# Patient Record
Sex: Female | Born: 1980 | Race: White | Hispanic: No | Marital: Married | Smoking: Former smoker
Health system: Southern US, Community
[De-identification: ages and names within clinical notes are randomized; demographics above are authoritative.]

## PROBLEM LIST (undated history)

## (undated) DIAGNOSIS — B019 Varicella without complication: Secondary | ICD-10-CM

## (undated) DIAGNOSIS — Z8781 Personal history of (healed) traumatic fracture: Secondary | ICD-10-CM

## (undated) HISTORY — DX: Varicella without complication: B01.9

## (undated) HISTORY — DX: Personal history of (healed) traumatic fracture: Z87.81

---

## 2003-05-05 ENCOUNTER — Encounter: Payer: Self-pay | Admitting: Gastroenterology

## 2003-05-05 ENCOUNTER — Ambulatory Visit (HOSPITAL_COMMUNITY): Admission: RE | Admit: 2003-05-05 | Discharge: 2003-05-05 | Payer: Self-pay | Admitting: Gastroenterology

## 2003-11-10 ENCOUNTER — Ambulatory Visit (HOSPITAL_COMMUNITY): Admission: RE | Admit: 2003-11-10 | Discharge: 2003-11-10 | Payer: Self-pay | Admitting: Internal Medicine

## 2003-12-07 ENCOUNTER — Ambulatory Visit (HOSPITAL_COMMUNITY): Admission: RE | Admit: 2003-12-07 | Discharge: 2003-12-07 | Payer: Self-pay | Admitting: Internal Medicine

## 2003-12-16 ENCOUNTER — Encounter: Admission: RE | Admit: 2003-12-16 | Discharge: 2003-12-16 | Payer: Self-pay | Admitting: Internal Medicine

## 2004-03-01 IMAGING — CT CT PELVIS W/ CM
1 series · 15 of 32 positions shown, 19 images · IV contrast (GASTRO & OMNIPAQUE 100)
Comparison: None.

CLINICAL DATA: 22 year old with chronic abdominal pain and constipation.  CON ? none.
 CT ABDOMEN AND PELVIS ? WITH CONTRAST ? 12/16/03
TECHNIQUE: During intravenous administration of 100 cc Omnipaque 300, helical CT through the abdomen and pelvis was performed at 5 mm collimation.  Oral contrast had been administered.  Delayed helical 5 mm images through the kidneys were obtained.

[Series 2: — · axial · 0.70mm/px · z∈[-305,-15]mm · 15 of 93 slices shown, 19 images]
[im 6/93  soft-tissue]
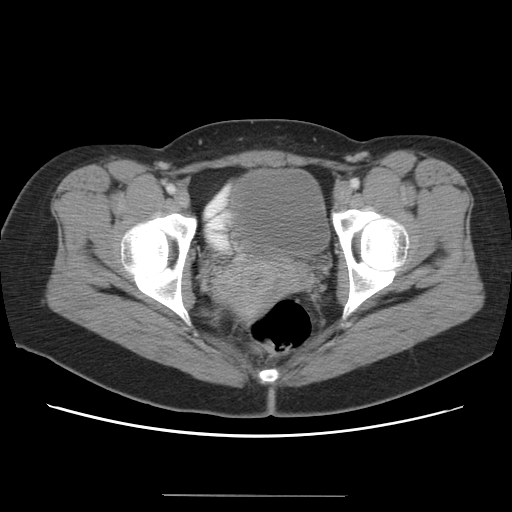
[im 6/93  bone]
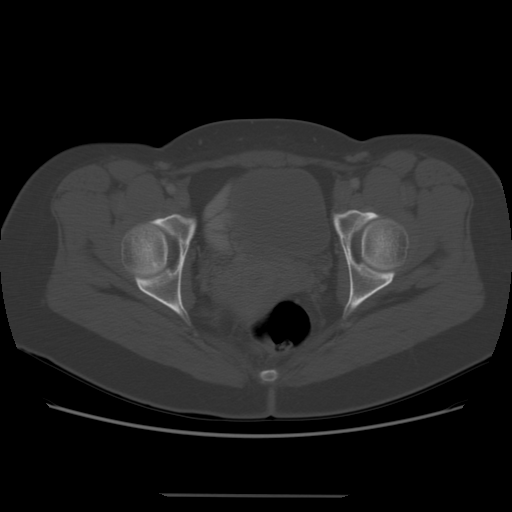
[im 12/93  soft-tissue]
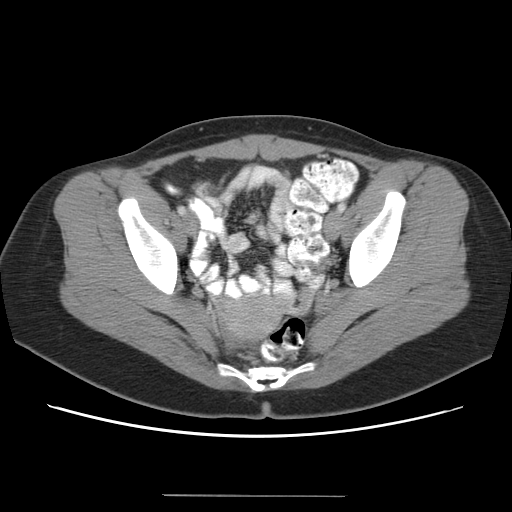
[im 18/93  soft-tissue]
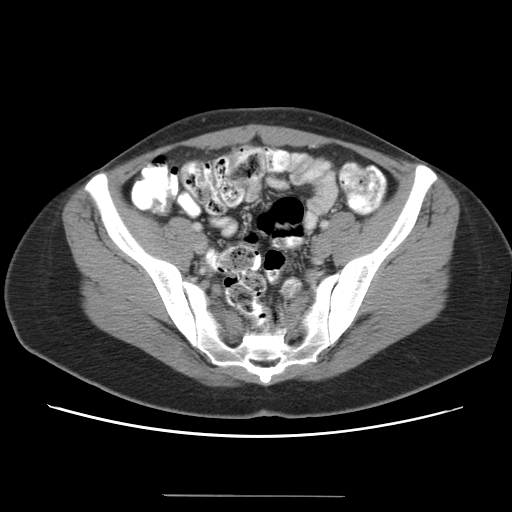
[im 27/93  soft-tissue]
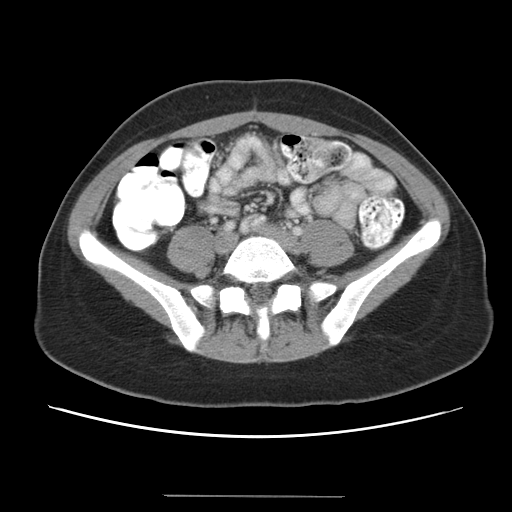
[im 33/93  soft-tissue]
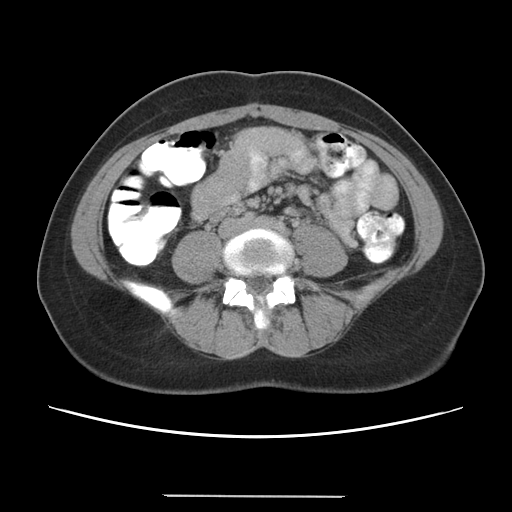
[im 39/93  soft-tissue]
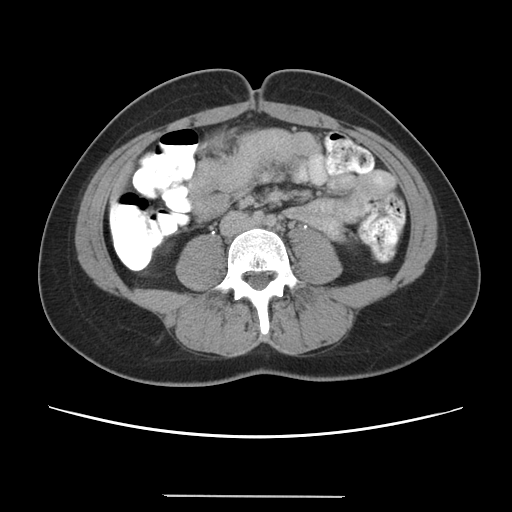
[im 48/93  soft-tissue]
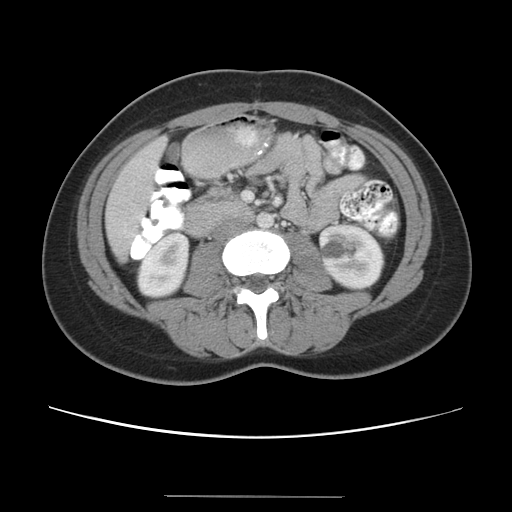
[im 54/93  soft-tissue]
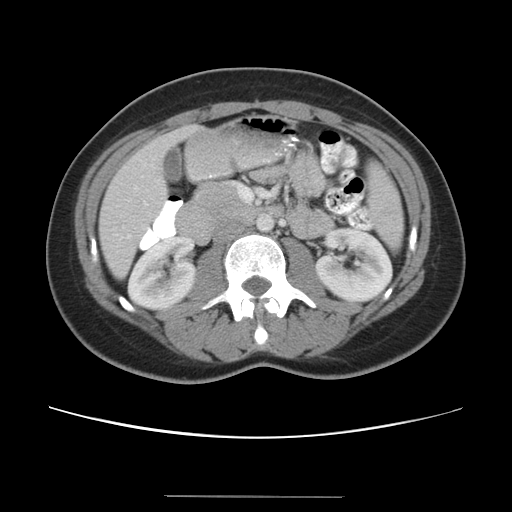
[im 60/93  soft-tissue]
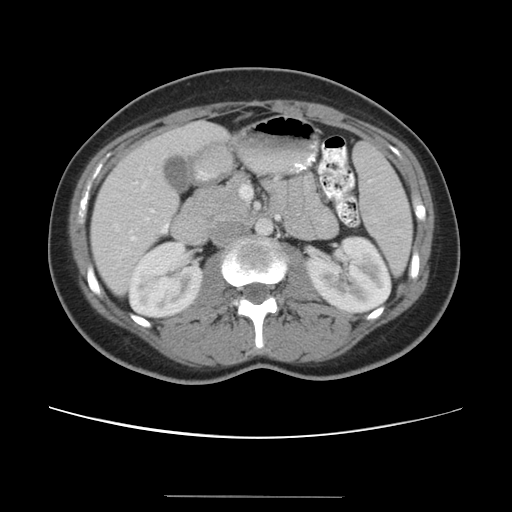
[im 60/93  bone]
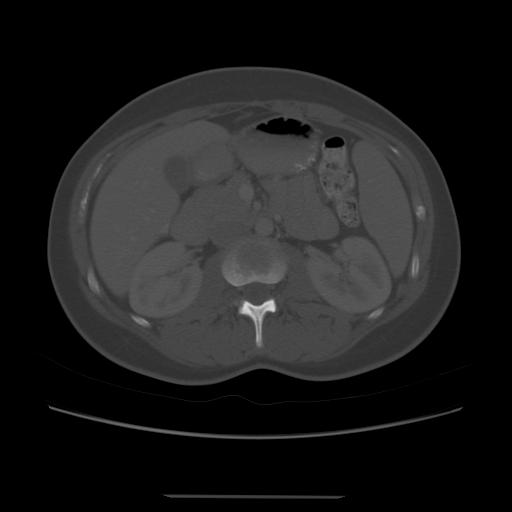
[im 66/93  soft-tissue]
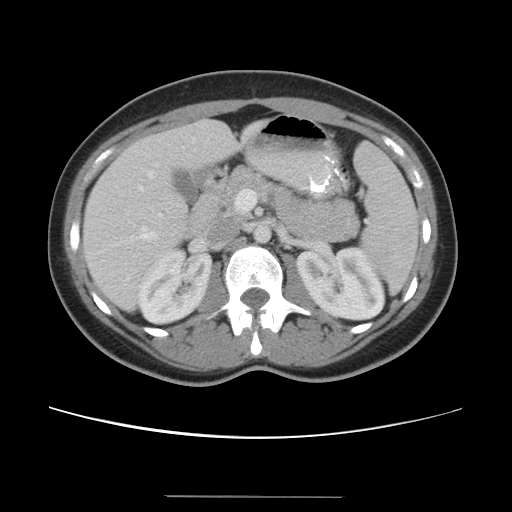
[im 75/93  soft-tissue]
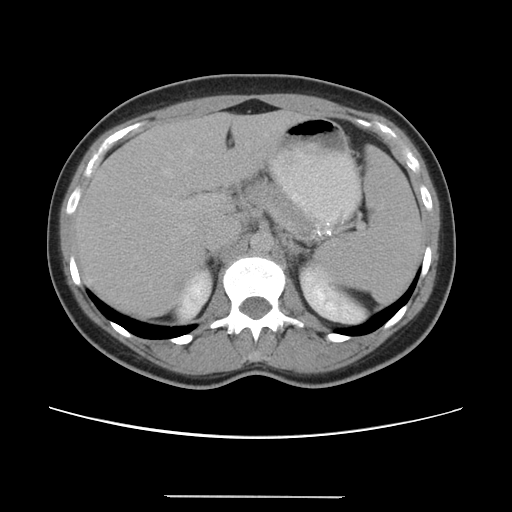
[im 81/93  soft-tissue]
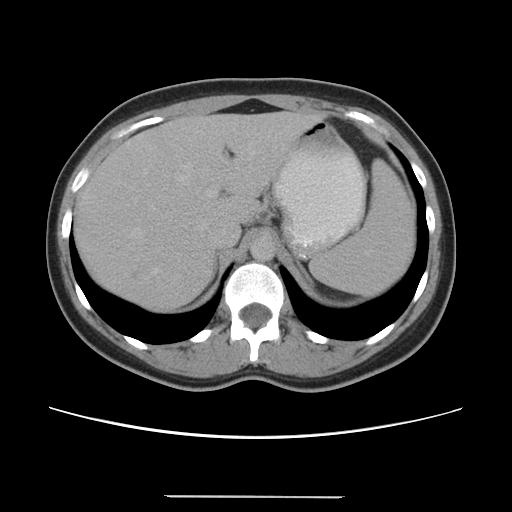
[im 81/93  lung]
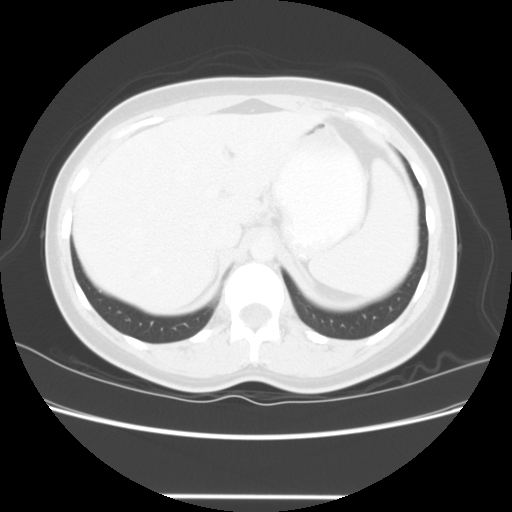
[im 84/93  lung]
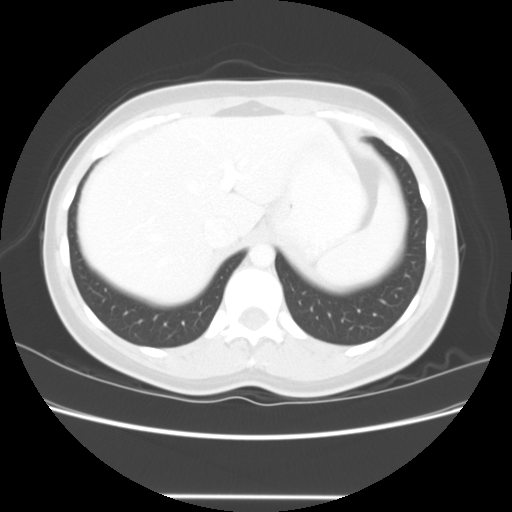
[im 87/93  soft-tissue]
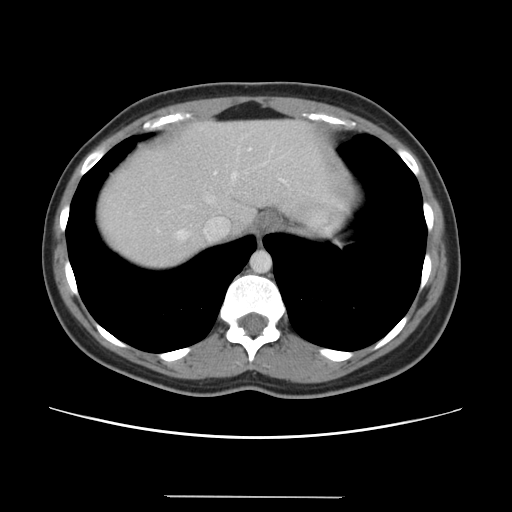
[im 87/93  lung]
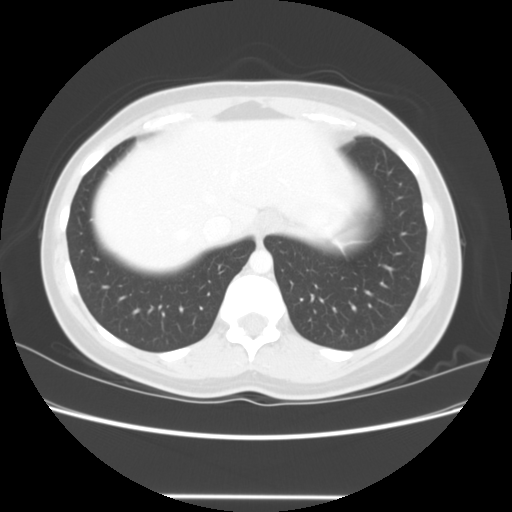
[im 90/93  lung]
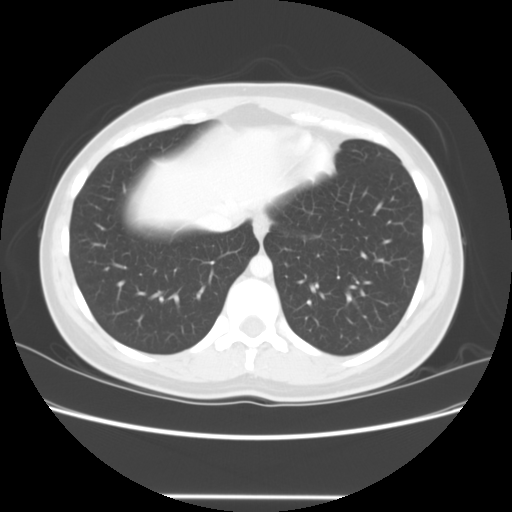

[15 of 32 positions shown; findings below may reference images not displayed]

CT ABDOMEN
 Liver is normal in size.  There is a 5 mm low attenuation lesion in the anterior segment of the right lobe near the dome (image 12) which is indeterminate but statistically most likely to represent a small cyst.  Spleen is upper normal in size and contains no focal abnormalities.  The pancreas has a normal appearance.  The gallbladder is unremarkable by CT and there is no biliary ductal dilatation.  Both adrenal glands are normal.  There is a simple cyst in the lower pole of the right kidney; no significant abnormalities are identified involving either kidney.  The stomach and visualized large and small bowel are unremarkable in the upper abdomen.  There is no significant lymphadenopathy.  The visualized lung bases appear clear.  
 IMPRESSION
 1.  Probable subcentimeter cyst in the anterior segment of the right lobe of the liver near the dome.
 2.  Left renal cyst.
 3.  Normal CT of the abdomen otherwise.
 CT PELVIS
 The uterus is normal in appearance for age.  There is an approximate 3.0 x 2.1 cm cyst arising from the left ovary.  Colon and small bowel are unremarkable.  The appendix is identified in the right mid pelvis and is normal in appearance.  There is no free fluid.  The urinary bladder is grossly normal. 
 IMPRESSION
 1.  Left ovarian cyst.
 2.  Normal CT of the pelvis otherwise.

## 2005-05-09 ENCOUNTER — Other Ambulatory Visit: Admission: RE | Admit: 2005-05-09 | Discharge: 2005-05-09 | Payer: Self-pay | Admitting: Internal Medicine

## 2005-05-09 ENCOUNTER — Ambulatory Visit: Payer: Self-pay | Admitting: Internal Medicine

## 2006-11-08 ENCOUNTER — Ambulatory Visit: Payer: Self-pay | Admitting: Internal Medicine

## 2006-12-16 ENCOUNTER — Ambulatory Visit: Payer: Self-pay | Admitting: Internal Medicine

## 2010-07-07 ENCOUNTER — Encounter: Payer: Self-pay | Admitting: Internal Medicine

## 2010-07-18 ENCOUNTER — Ambulatory Visit: Payer: Self-pay | Admitting: Internal Medicine

## 2011-01-16 NOTE — Assessment & Plan Note (Signed)
Summary: inj per kim//ccm  Nurse Visit   Allergies: No Known Drug Allergies  Immunizations Administered:  Tetanus Vaccine:    Vaccine Type: Tdap    Site: right deltoid    Mfr: [employer]    Dose: 0.5 ml    Route: IM    Given by: Duard Brady LPN    Exp. Date: 01/05/2012    Lot #: [id number]    VIS given: 11/04/07 version given July 18, 2010.    Physician counseled: yes  Orders Added: 1)  Tdap => 5yrs IM [90715] 2)  Admin 1st Vaccine [09811]

## 2011-01-16 NOTE — Miscellaneous (Signed)
Summary: Immunizations  Immunizations   Imported By: Georgian Co 07/07/2010 16:44:31  _____________________________________________________________________  External Attachment:    Type:   Image     Comment:   External Document

## 2011-05-08 ENCOUNTER — Other Ambulatory Visit: Payer: Self-pay

## 2011-05-08 MED ORDER — NORGESTIMATE-ETH ESTRADIOL 0.25-35 MG-MCG PO TABS: 1.0000 | ORAL_TABLET | Freq: Every day | ORAL | Status: DC

## 2011-05-08 NOTE — Telephone Encounter (Signed)
[email or web address] 

## 2012-03-25 ENCOUNTER — Other Ambulatory Visit: Payer: Self-pay

## 2012-03-25 MED ORDER — NORGESTIM-ETH ESTRAD TRIPHASIC 0.18/0.215/0.25 MG-35 MCG PO TABS: 1.0000 | ORAL_TABLET | Freq: Every day | ORAL | Status: DC

## 2012-03-25 MED ORDER — NORGESTIM-ETH ESTRAD TRIPHASIC 0.18/0.215/0.25 MG-35 MCG PO TABS: 1.0000 | ORAL_TABLET | Freq: Every day | ORAL | Status: AC

## 2013-04-24 ENCOUNTER — Other Ambulatory Visit (HOSPITAL_COMMUNITY)
Admission: RE | Admit: 2013-04-24 | Discharge: 2013-04-24 | Disposition: A | Discharge status: Home / Self Care | Payer: BC Managed Care – PPO | Source: Ambulatory Visit | Attending: Internal Medicine | Admitting: Internal Medicine

## 2013-04-24 ENCOUNTER — Encounter: Payer: Self-pay | Admitting: Internal Medicine

## 2013-04-24 ENCOUNTER — Ambulatory Visit (INDEPENDENT_AMBULATORY_CARE_PROVIDER_SITE_OTHER): Payer: BC Managed Care – PPO | Admitting: Internal Medicine

## 2013-04-24 VITALS — BP 106/80 | HR 59 | Temp 98.1°F | Ht 62.0 in | Wt 129.0 lb

## 2013-04-24 DIAGNOSIS — Z111 Encounter for screening for respiratory tuberculosis: Secondary | ICD-10-CM | POA: Insufficient documentation

## 2013-04-24 DIAGNOSIS — Z3041 Encounter for surveillance of contraceptive pills: Secondary | ICD-10-CM

## 2013-04-24 DIAGNOSIS — Z01419 Encounter for gynecological examination (general) (routine) without abnormal findings: Secondary | ICD-10-CM | POA: Insufficient documentation

## 2013-04-24 DIAGNOSIS — Z1151 Encounter for screening for human papillomavirus (HPV): Secondary | ICD-10-CM | POA: Insufficient documentation

## 2013-04-24 DIAGNOSIS — Z0000 Encounter for general adult medical examination without abnormal findings: Secondary | ICD-10-CM | POA: Insufficient documentation

## 2013-04-24 LAB — CBC WITH DIFFERENTIAL/PLATELET
Basophils Absolute: 0 10*3/uL (ref 0.0–0.1)
Basophils Relative: 0.4 % (ref 0.0–3.0)
Eosinophils Absolute: 0.2 10*3/uL (ref 0.0–0.7)
Eosinophils Relative: 3.7 % (ref 0.0–5.0)
HCT: 41.1 % (ref 36.0–46.0)
Hemoglobin: 14.1 g/dL (ref 12.0–15.0)
Lymphocytes Relative: 40.7 % (ref 12.0–46.0)
Lymphs Abs: 2.1 10*3/uL (ref 0.7–4.0)
MCHC: 34.4 g/dL (ref 30.0–36.0)
MCV: 93.1 fl (ref 78.0–100.0)
Monocytes Absolute: 0.5 10*3/uL (ref 0.1–1.0)
Monocytes Relative: 9.6 % (ref 3.0–12.0)
Neutro Abs: 2.3 10*3/uL (ref 1.4–7.7)
Neutrophils Relative %: 45.6 % (ref 43.0–77.0)
Platelets: 242 10*3/uL (ref 150.0–400.0)
RBC: 4.41 Mil/uL (ref 3.87–5.11)
RDW: 12.5 % (ref 11.5–14.6)
WBC: 5.1 10*3/uL (ref 4.5–10.5)

## 2013-04-24 LAB — HEPATIC FUNCTION PANEL
ALT: 21 U/L (ref 0–35)
AST: 21 U/L (ref 0–37)
Albumin: 4.1 g/dL (ref 3.5–5.2)
Alkaline Phosphatase: 43 U/L (ref 39–117)
Bilirubin, Direct: 0 mg/dL (ref 0.0–0.3)
Total Bilirubin: 0.5 mg/dL (ref 0.3–1.2)
Total Protein: 7.1 g/dL (ref 6.0–8.3)

## 2013-04-24 LAB — BASIC METABOLIC PANEL
BUN: 11 mg/dL (ref 6–23)
CO2: 27 mEq/L (ref 19–32)
Calcium: 9.1 mg/dL (ref 8.4–10.5)
Chloride: 104 mEq/L (ref 96–112)
Creatinine, Ser: 0.7 mg/dL (ref 0.4–1.2)
GFR: 98.09 mL/min (ref >60.00)
Glucose, Bld: 82 mg/dL (ref 70–99)
Potassium: 4.4 mEq/L (ref 3.5–5.1)
Sodium: 137 mEq/L (ref 135–145)

## 2013-04-24 LAB — LIPID PANEL
Cholesterol: 187 mg/dL (ref 0–200)
HDL: 58.1 mg/dL (ref >39.00)
LDL Cholesterol: 119 mg/dL — ABNORMAL HIGH (ref 0–99)
Total CHOL/HDL Ratio: 3
Triglycerides: 49 mg/dL (ref 0.0–149.0)
VLDL: 9.8 mg/dL (ref 0.0–40.0)

## 2013-04-24 LAB — TSH: TSH: 0.42 u[IU]/mL (ref 0.35–5.50)

## 2013-04-24 MED ORDER — NORETHINDRONE ACET-ETHINYL EST 1.5-30 MG-MCG PO TABS: 1.0000 | ORAL_TABLET | Freq: Every day | ORAL | Status: DC

## 2013-04-24 NOTE — Patient Instructions (Addendum)
Continue lifestyle intervention healthy eating and exercise  Will notify you  of labs when available. Can also check on My chart .   Get PPD   Reading in 72 hours   Preventive Care for Adults, Female A healthy lifestyle and preventive care can promote health and wellness. Preventive health guidelines for women include the following key practices.  A routine yearly physical is a good way to check with your caregiver about your health and preventive screening. It is a chance to share any concerns and updates on your health, and to receive a thorough exam.  Visit your dentist for a routine exam and preventive care every 6 months. Brush your teeth twice a day and floss once a day. Good oral hygiene prevents tooth decay and gum disease.  The frequency of eye exams is based on your age, health, family medical history, use of contact lenses, and other factors. Follow your caregiver's recommendations for frequency of eye exams.  Eat a healthy diet. Foods like vegetables, fruits, whole grains, low-fat dairy products, and lean protein foods contain the nutrients you need without too many calories. Decrease your intake of foods high in solid fats, added sugars, and salt. Eat the right amount of calories for you.Get information about a proper diet from your caregiver, if necessary.  Regular physical exercise is one of the most important things you can do for your health. Most adults should get at least 150 minutes of moderate-intensity exercise (any activity that increases your heart rate and causes you to sweat) each week. In addition, most adults need muscle-strengthening exercises on 2 or more days a week.  Maintain a healthy weight. The body mass index (BMI) is a screening tool to identify possible weight problems. It provides an estimate of body fat based on height and weight. Your caregiver can help determine your BMI, and can help you achieve or maintain a healthy weight.For adults 20 years and  older:  A BMI below 18.5 is considered underweight.  A BMI of 18.5 to 24.9 is normal.  A BMI of 25 to 29.9 is considered overweight.  A BMI of 30 and above is considered obese.  Maintain normal blood lipids and cholesterol levels by exercising and minimizing your intake of saturated fat. Eat a balanced diet with plenty of fruit and vegetables. Blood tests for lipids and cholesterol should begin at age 18 and be repeated every 5 years. If your lipid or cholesterol levels are high, you are over 50, or you are at high risk for heart disease, you may need your cholesterol levels checked more frequently.Ongoing high lipid and cholesterol levels should be treated with medicines if diet and exercise are not effective.  If you smoke, find out from your caregiver how to quit. If you do not use tobacco, do not start.  If you are pregnant, do not drink alcohol. If you are breastfeeding, be very cautious about drinking alcohol. If you are not pregnant and choose to drink alcohol, do not exceed 1 drink per day. One drink is considered to be 12 ounces (355 mL) of beer, 5 ounces (148 mL) of wine, or 1.5 ounces (44 mL) of liquor.  Avoid use of street drugs. Do not share needles with anyone. Ask for help if you need support or instructions about stopping the use of drugs.  High blood pressure causes heart disease and increases the risk of stroke. Your blood pressure should be checked at least every 1 to 2 years. Ongoing high blood pressure  should be treated with medicines if weight loss and exercise are not effective.  If you are 9 to 32 years old, ask your caregiver if you should take aspirin to prevent strokes.  Diabetes screening involves taking a blood sample to check your fasting blood sugar level. This should be done once every 3 years, after age 62, if you are within normal weight and without risk factors for diabetes. Testing should be considered at a younger age or be carried out more frequently if  you are overweight and have at least 1 risk factor for diabetes.  Breast cancer screening is essential preventive care for women. You should practice "breast self-awareness." This means understanding the normal appearance and feel of your breasts and may include breast self-examination. Any changes detected, no matter how small, should be reported to a caregiver. Women in their 28s and 30s should have a clinical breast exam (CBE) by a caregiver as part of a regular health exam every 1 to 3 years. After age 81, women should have a CBE every year. Starting at age 71, women should consider having a mammography (breast X-ray test) every year. Women who have a family history of breast cancer should talk to their caregiver about genetic screening. Women at a high risk of breast cancer should talk to their caregivers about having magnetic resonance imaging (MRI) and a mammography every year.  The Pap test is a screening test for cervical cancer. A Pap test can show cell changes on the cervix that might become cervical cancer if left untreated. A Pap test is a procedure in which cells are obtained and examined from the lower end of the uterus (cervix).  Women should have a Pap test starting at age 66.  Between ages 25 and 59, Pap tests should be repeated every 2 years.  Beginning at age 61, you should have a Pap test every 3 years as long as the past 3 Pap tests have been normal.  Some women have medical problems that increase the chance of getting cervical cancer. Talk to your caregiver about these problems. It is especially important to talk to your caregiver if a new problem develops soon after your last Pap test. In these cases, your caregiver may recommend more frequent screening and Pap tests.  The above recommendations are the same for women who have or have not gotten the vaccine for human papillomavirus (HPV).  If you had a hysterectomy for a problem that was not cancer or a condition that could  lead to cancer, then you no longer need Pap tests. Even if you no longer need a Pap test, a regular exam is a good idea to make sure no other problems are starting.  If you are between ages 57 and 28, and you have had normal Pap tests going back 10 years, you no longer need Pap tests. Even if you no longer need a Pap test, a regular exam is a good idea to make sure no other problems are starting.  If you have had past treatment for cervical cancer or a condition that could lead to cancer, you need Pap tests and screening for cancer for at least 20 years after your treatment.  If Pap tests have been discontinued, risk factors (such as a new sexual partner) need to be reassessed to determine if screening should be resumed.  The HPV test is an additional test that may be used for cervical cancer screening. The HPV test looks for the virus that can  cause the cell changes on the cervix. The cells collected during the Pap test can be tested for HPV. The HPV test could be used to screen women aged 15 years and older, and should be used in women of any age who have unclear Pap test results. After the age of 66, women should have HPV testing at the same frequency as a Pap test.  Colorectal cancer can be detected and often prevented. Most routine colorectal cancer screening begins at the age of 53 and continues through age 41. However, your caregiver may recommend screening at an earlier age if you have risk factors for colon cancer. On a yearly basis, your caregiver may provide home test kits to check for hidden blood in the stool. Use of a small camera at the end of a tube, to directly examine the colon (sigmoidoscopy or colonoscopy), can detect the earliest forms of colorectal cancer. Talk to your caregiver about this at age 34, when routine screening begins. Direct examination of the colon should be repeated every 5 to 10 years through age 48, unless early forms of pre-cancerous polyps or small growths are  found.  Hepatitis C blood testing is recommended for all people born from [birth year] through 1965 and any individual with known risks for hepatitis C.  Practice safe sex. Use condoms and avoid high-risk sexual practices to reduce the spread of sexually transmitted infections (STIs). STIs include gonorrhea, chlamydia, syphilis, trichomonas, herpes, HPV, and human immunodeficiency virus (HIV). Herpes, HIV, and HPV are viral illnesses that have no cure. They can result in disability, cancer, and death. Sexually active women aged 91 and younger should be checked for chlamydia. Older women with new or multiple partners should also be tested for chlamydia. Testing for other STIs is recommended if you are sexually active and at increased risk.  Osteoporosis is a disease in which the bones lose minerals and strength with aging. This can result in serious bone fractures. The risk of osteoporosis can be identified using a bone density scan. Women ages 19 and over and women at risk for fractures or osteoporosis should discuss screening with their caregivers. Ask your caregiver whether you should take a calcium supplement or vitamin D to reduce the rate of osteoporosis.  Menopause can be associated with physical symptoms and risks. Hormone replacement therapy is available to decrease symptoms and risks. You should talk to your caregiver about whether hormone replacement therapy is right for you.  Use sunscreen with sun protection factor (SPF) of 30 or more. Apply sunscreen liberally and repeatedly throughout the day. You should seek shade when your shadow is shorter than you. Protect yourself by wearing long sleeves, pants, a wide-brimmed hat, and sunglasses year round, whenever you are outdoors.  Once a month, do a whole body skin exam, using a mirror to look at the skin on your back. Notify your caregiver of new moles, moles that have irregular borders, moles that are larger than a pencil eraser, or moles that have  changed in shape or color.  Stay current with required immunizations.  Influenza. You need a dose every fall (or winter). The composition of the flu vaccine changes each year, so being vaccinated once is not enough.  Pneumococcal polysaccharide. You need 1 to 2 doses if you smoke cigarettes or if you have certain chronic medical conditions. You need 1 dose at age 71 (or older) if you have never been vaccinated.  Tetanus, diphtheria, pertussis (Tdap, Td). Get 1 dose of Tdap vaccine if you  are younger than age 2, are over 37 and have contact with an infant, are a Research scientist (physical sciences), are pregnant, or simply want to be protected from whooping cough. After that, you need a Td booster dose every 10 years. Consult your caregiver if you have not had at least 3 tetanus and diphtheria-containing shots sometime in your life or have a deep or dirty wound.  HPV. You need this vaccine if you are a woman age 64 or younger. The vaccine is given in 3 doses over 6 months.  Measles, mumps, rubella (MMR). You need at least 1 dose of MMR if you were born in 1957 or later. You may also need a second dose.  Meningococcal. If you are age 39 to 68 and a first-year college student living in a residence hall, or have one of several medical conditions, you need to get vaccinated against meningococcal disease. You may also need additional booster doses.  Zoster (shingles). If you are age 27 or older, you should get this vaccine.  Varicella (chickenpox). If you have never had chickenpox or you were vaccinated but received only 1 dose, talk to your caregiver to find out if you need this vaccine.  Hepatitis A. You need this vaccine if you have a specific risk factor for hepatitis A virus infection or you simply wish to be protected from this disease. The vaccine is usually given as 2 doses, 6 to 18 months apart.  Hepatitis B. You need this vaccine if you have a specific risk factor for hepatitis B virus infection or you  simply wish to be protected from this disease. The vaccine is given in 3 doses, usually over 6 months. Preventive Services / Frequency Ages 27 to 82  Blood pressure check.** / Every 1 to 2 years.  Lipid and cholesterol check.** / Every 5 years beginning at age 10.  Clinical breast exam.** / Every 3 years for women in their 50s and 30s.  Pap test.** / Every 2 years from ages 18 through 62. Every 3 years starting at age 9 through age 8 or 13 with a history of 3 consecutive normal Pap tests.  HPV screening.** / Every 3 years from ages 8 through ages 58 to 93 with a history of 3 consecutive normal Pap tests.  Hepatitis C blood test.** / For any individual with known risks for hepatitis C.  Skin self-exam. / Monthly.  Influenza immunization.** / Every year.  Pneumococcal polysaccharide immunization.** / 1 to 2 doses if you smoke cigarettes or if you have certain chronic medical conditions.  Tetanus, diphtheria, pertussis (Tdap, Td) immunization. / A one-time dose of Tdap vaccine. After that, you need a Td booster dose every 10 years.  HPV immunization. / 3 doses over 6 months, if you are 54 and younger.  Measles, mumps, rubella (MMR) immunization. / You need at least 1 dose of MMR if you were born in 1957 or later. You may also need a second dose.  Meningococcal immunization. / 1 dose if you are age 2 to 52 and a first-year college student living in a residence hall, or have one of several medical conditions, you need to get vaccinated against meningococcal disease. You may also need additional booster doses.  Varicella immunization.** / Consult your caregiver.  Hepatitis A immunization.** / Consult your caregiver. 2 doses, 6 to 18 months apart.  Hepatitis B immunization.** / Consult your caregiver. 3 doses usually over 6 months. Ages 60 to 59  Blood pressure check.** / Every 1  to 2 years.  Lipid and cholesterol check.** / Every 5 years beginning at age 66.  Clinical breast  exam.** / Every year after age 38.  Mammogram.** / Every year beginning at age 39 and continuing for as long as you are in good health. Consult with your caregiver.  Pap test.** / Every 3 years starting at age 52 through age 8 or 61 with a history of 3 consecutive normal Pap tests.  HPV screening.** / Every 3 years from ages 17 through ages 10 to 29 with a history of 3 consecutive normal Pap tests.  Fecal occult blood test (FOBT) of stool. / Every year beginning at age 76 and continuing until age 1. You may not need to do this test if you get a colonoscopy every 10 years.  Flexible sigmoidoscopy or colonoscopy.** / Every 5 years for a flexible sigmoidoscopy or every 10 years for a colonoscopy beginning at age 56 and continuing until age 38.  Hepatitis C blood test.** / For all people born from [birth year] through 1965 and any individual with known risks for hepatitis C.  Skin self-exam. / Monthly.  Influenza immunization.** / Every year.  Pneumococcal polysaccharide immunization.** / 1 to 2 doses if you smoke cigarettes or if you have certain chronic medical conditions.  Tetanus, diphtheria, pertussis (Tdap, Td) immunization.** / A one-time dose of Tdap vaccine. After that, you need a Td booster dose every 10 years.  Measles, mumps, rubella (MMR) immunization. / You need at least 1 dose of MMR if you were born in 1957 or later. You may also need a second dose.  Varicella immunization.** / Consult your caregiver.  Meningococcal immunization.** / Consult your caregiver.  Hepatitis A immunization.** / Consult your caregiver. 2 doses, 6 to 18 months apart.  Hepatitis B immunization.** / Consult your caregiver. 3 doses, usually over 6 months. Ages 71 and over  Blood pressure check.** / Every 1 to 2 years.  Lipid and cholesterol check.** / Every 5 years beginning at age 22.  Clinical breast exam.** / Every year after age 77.  Mammogram.** / Every year beginning at age 94 and continuing  for as long as you are in good health. Consult with your caregiver.  Pap test.** / Every 3 years starting at age 43 through age 78 or 5 with a 3 consecutive normal Pap tests. Testing can be stopped between 65 and 70 with 3 consecutive normal Pap tests and no abnormal Pap or HPV tests in the past 10 years.  HPV screening.** / Every 3 years from ages 78 through ages 54 or 11 with a history of 3 consecutive normal Pap tests. Testing can be stopped between 65 and 70 with 3 consecutive normal Pap tests and no abnormal Pap or HPV tests in the past 10 years.  Fecal occult blood test (FOBT) of stool. / Every year beginning at age 41 and continuing until age 44. You may not need to do this test if you get a colonoscopy every 10 years.  Flexible sigmoidoscopy or colonoscopy.** / Every 5 years for a flexible sigmoidoscopy or every 10 years for a colonoscopy beginning at age 28 and continuing until age 3.  Hepatitis C blood test.** / For all people born from [birth year] through 1965 and any individual with known risks for hepatitis C.  Osteoporosis screening.** / A one-time screening for women ages 108 and over and women at risk for fractures or osteoporosis.  Skin self-exam. / Monthly.  Influenza immunization.** / Every year.  Pneumococcal polysaccharide immunization.** / 1 dose at age 1 (or older) if you have never been vaccinated.  Tetanus, diphtheria, pertussis (Tdap, Td) immunization. / A one-time dose of Tdap vaccine if you are over 65 and have contact with an infant, are a Research scientist (physical sciences), or simply want to be protected from whooping cough. After that, you need a Td booster dose every 10 years.  Varicella immunization.** / Consult your caregiver.  Meningococcal immunization.** / Consult your caregiver.  Hepatitis A immunization.** / Consult your caregiver. 2 doses, 6 to 18 months apart.  Hepatitis B immunization.** / Check with your caregiver. 3 doses, usually over 6 months. ** Family history  and personal history of risk and conditions may change your caregiver's recommendations. Document Released: 01/29/2002 Document Revised: 02/25/2012 Document Reviewed: 04/30/2011 [facility] Patient Information 2013 [place], [place].

## 2013-04-24 NOTE — Progress Notes (Signed)
Chief Complaint  Patient presents with  . 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 She is generally well has no major illnesses chronic diseases or medications except for her OCPs which are helpful for cramps. Has no major concerns today. No major TB exposures her immunizations she feels are up-to-date. Had varicella disease Uncertain when she had her last Pap ROS:  GEN/ HEENT: No fever, significant weight changes sweats headaches vision problems hearing changes, CV/ PULM; No chest pain shortness of breath cough, syncope,edema  change in exercise tolerance. GI /GU: No adominal pain, vomiting, change in bowel habits. No blood in the stool. No significant GU symptoms. SKIN/HEME: ,no acute skin rashes suspicious lesions or bleeding. No lymphadenopathy, nodules, masses.  NEURO/ PSYCH:  No neurologic signs such as weakness numbness. No depression anxiety. IMM/ Allergy: No unusual infections.  Allergy .   REST of 12 system review negative except as per HPI   Past Medical History  Diagnosis Date  . Chicken pox   . Hx of fracture of arm     remote no residual    Family History  Problem Relation Age of Onset  . Heart disease      non first degree relative   . ADD / ADHD      sib     History   Social History  . Marital Status: Married    Spouse Name: N/A    Number of Children: N/A  . Years of Education: N/A   Social History Main Topics  . Smoking status: Never Smoker   . Smokeless tobacco: None  . Alcohol Use: Yes  . Drug Use: None  . Sexually Active: None   Other Topics Concern  . None   Social History Narrative   Single to go to vet school [place]   Neg td              Outpatient Encounter Prescriptions as of 04/24/2013  Medication Sig Dispense Refill  . Norethindrone Acetate-Ethinyl Estradiol  (MICROGESTIN) 1.5-30 MG-MCG tablet Take 1 tablet by mouth daily.  3 Package  3  . [DISCONTINUED] Norethindrone Acetate-Ethinyl Estradiol (MICROGESTIN) 1.5-30 MG-MCG tablet Take 1 tablet by mouth daily.       No facility-administered encounter medications on file as of 04/24/2013.    EXAM:  BP 106/80  Pulse 59  Temp(Src) 98.1 F (36.7 C) (Oral)  Ht 5\' 2"  (1.575 m)  Wt 129 lb (58.514 kg)  BMI 23.59 kg/m2  SpO2 98%  LMP 04/03/2013  Body mass index is 23.59 kg/(m^2).  Physical Exam: Vital signs reviewed ZOX:[id number] is a well-developed well-nourished alert cooperative   female who appears her stated age in no acute distress.  HEENT: normocephalic atraumatic , Eyes: PERRL EOM's full, conjunctiva clear, Nares: paten,t no deformity discharge or tenderness., Ears: no deformity EAC's clear TMs with normal landmarks. Mouth: clear OP, no lesions, edema.  Moist mucous membranes. Dentition in adequate repair. NECK: supple without masses, thyromegaly or bruits. Breast: normal by inspection . No dimpling, discharge, masses, tenderness or discharge . CHEST/PULM:  Clear to auscultation and percussion breath sounds equal no wheeze , rales or rhonchi. No chest wall deformities or tenderness. CV: PMI is nondisplaced, S1 S2 no gallops, murmurs, rubs. Peripheral pulses are full without delay.No JVD .  ABDOMEN: Bowel  sounds normal nontender  No guard or rebound, no hepato splenomegal no CVA tenderness.  No hernia. Extremtities:  No clubbing cyanosis or edema, no acute joint swelling or redness no focal atrophy NEURO:  Oriented x3, cranial nerves 3-12 appear to be intact, no obvious focal weakness,gait within normal limits no abnormal reflexes or asymmetrical SKIN: No acute rashes normal turgor, color, no bruising or petechiae. PSYCH: Oriented, good eye contact, no obvious depression anxiety, cognition and judgment appear normal. LN: no cervical axillary inguinal adenopathy Pelvic: NL ext GU, labia clear without  lesions or rash . Vagina no lesions .Cervix: clear  UTERUS: Neg CMT Adnexa:  clear no masses . PAP done chl screen    Lab Results  Component Value Date   WBC 5.1 04/24/2013    ASSESSMENT AND PLAN:  Discussed the following assessment and plan:  Visit for preventive health examination - Plan: Basic metabolic panel, CBC with Differential, Hepatic function panel, Lipid panel, TSH, Norethindrone Acetate-Ethinyl Estradiol (MICROGESTIN) 1.5-30 MG-MCG tablet, PAP [Krebs], DISCONTINUED: Norethindrone Acetate-Ethinyl Estradiol (MICROGESTIN) 1.5-30 MG-MCG tablet  Routine gynecological examination - Plan: Basic metabolic panel, CBC with Differential, Hepatic function panel, Lipid panel, TSH, Norethindrone Acetate-Ethinyl Estradiol (MICROGESTIN) 1.5-30 MG-MCG tablet, PAP [Hercules], DISCONTINUED: Norethindrone Acetate-Ethinyl Estradiol (MICROGESTIN) 1.5-30 MG-MCG tablet  Screening-pulmonary TB - Plan: TB Skin Test Counseled regarding healthy nutrition, exercise, sleep, injury prevention, calcium vit d and healthy weight .  Patient Care Team: Madelin Headings, MD as PCP - General Patient Instructions  Continue lifestyle intervention healthy eating and exercise  Will notify you  of labs when available. Can also check on My chart .   Get PPD   Reading in 72 hours   Preventive Care for Adults, Female A healthy lifestyle and preventive care can promote health and wellness. Preventive health guidelines for women include the following key practices.  A routine yearly physical is a good way to check with your caregiver about your health and preventive screening. It is a chance to share any concerns and updates on your health, and to receive a thorough exam.  Visit your dentist for a routine exam and preventive care every 6 months. Brush your teeth twice a day and floss once a day. Good oral hygiene prevents tooth decay and gum disease.  The frequency of eye exams is based on your age, health,  family medical history, use of contact lenses, and other factors. Follow your caregiver's recommendations for frequency of eye exams.  Eat a healthy diet. Foods like vegetables, fruits, whole grains, low-fat dairy products, and lean protein foods contain the nutrients you need without too many calories. Decrease your intake of foods high in solid fats, added sugars, and salt. Eat the right amount of calories for you.Get information about a proper diet from your caregiver, if necessary.  Regular physical exercise is one of the most important things you can do for your health. Most adults should get at least 150 minutes of moderate-intensity exercise (any activity that increases your heart rate and causes you to sweat) each week. In addition, most adults need muscle-strengthening exercises on 2 or more days a week.  Maintain a healthy weight. The body mass index (BMI) is a screening tool to identify possible weight problems. It provides an estimate of body fat based on height and weight. Your caregiver can help determine your BMI, and can help you achieve or maintain a healthy weight.For adults 20 years and older:  A BMI below 18.5 is considered underweight.  A  BMI of 18.5 to 24.9 is normal.  A BMI of 25 to 29.9 is considered overweight.  A BMI of 30 and above is considered obese.  Maintain normal blood lipids and cholesterol levels by exercising and minimizing your intake of saturated fat. Eat a balanced diet with plenty of fruit and vegetables. Blood tests for lipids and cholesterol should begin at age 50 and be repeated every 5 years. If your lipid or cholesterol levels are high, you are over 50, or you are at high risk for heart disease, you may need your cholesterol levels checked more frequently.Ongoing high lipid and cholesterol levels should be treated with medicines if diet and exercise are not effective.  If you smoke, find out from your caregiver how to quit. If you do not use tobacco,  do not start.  If you are pregnant, do not drink alcohol. If you are breastfeeding, be very cautious about drinking alcohol. If you are not pregnant and choose to drink alcohol, do not exceed 1 drink per day. One drink is considered to be 12 ounces (355 mL) of beer, 5 ounces (148 mL) of wine, or 1.5 ounces (44 mL) of liquor.  Avoid use of street drugs. Do not share needles with anyone. Ask for help if you need support or instructions about stopping the use of drugs.  High blood pressure causes heart disease and increases the risk of stroke. Your blood pressure should be checked at least every 1 to 2 years. Ongoing high blood pressure should be treated with medicines if weight loss and exercise are not effective.  If you are 72 to 32 years old, ask your caregiver if you should take aspirin to prevent strokes.  Diabetes screening involves taking a blood sample to check your fasting blood sugar level. This should be done once every 3 years, after age 47, if you are within normal weight and without risk factors for diabetes. Testing should be considered at a younger age or be carried out more frequently if you are overweight and have at least 1 risk factor for diabetes.  Breast cancer screening is essential preventive care for women. You should practice "breast self-awareness." This means understanding the normal appearance and feel of your breasts and may include breast self-examination. Any changes detected, no matter how small, should be reported to a caregiver. Women in their 59s and 30s should have a clinical breast exam (CBE) by a caregiver as part of a regular health exam every 1 to 3 years. After age 10, women should have a CBE every year. Starting at age 77, women should consider having a mammography (breast X-ray test) every year. Women who have a family history of breast cancer should talk to their caregiver about genetic screening. Women at a high risk of breast cancer should talk to their  caregivers about having magnetic resonance imaging (MRI) and a mammography every year.  The Pap test is a screening test for cervical cancer. A Pap test can show cell changes on the cervix that might become cervical cancer if left untreated. A Pap test is a procedure in which cells are obtained and examined from the lower end of the uterus (cervix).  Women should have a Pap test starting at age 49.  Between ages 13 and 83, Pap tests should be repeated every 2 years.  Beginning at age 57, you should have a Pap test every 3 years as long as the past 3 Pap tests have been normal.  Some women have medical  problems that increase the chance of getting cervical cancer. Talk to your caregiver about these problems. It is especially important to talk to your caregiver if a new problem develops soon after your last Pap test. In these cases, your caregiver may recommend more frequent screening and Pap tests.  The above recommendations are the same for women who have or have not gotten the vaccine for human papillomavirus (HPV).  If you had a hysterectomy for a problem that was not cancer or a condition that could lead to cancer, then you no longer need Pap tests. Even if you no longer need a Pap test, a regular exam is a good idea to make sure no other problems are starting.  If you are between ages 5 and 5, and you have had normal Pap tests going back 10 years, you no longer need Pap tests. Even if you no longer need a Pap test, a regular exam is a good idea to make sure no other problems are starting.  If you have had past treatment for cervical cancer or a condition that could lead to cancer, you need Pap tests and screening for cancer for at least 20 years after your treatment.  If Pap tests have been discontinued, risk factors (such as a new sexual partner) need to be reassessed to determine if screening should be resumed.  The HPV test is an additional test that may be used for cervical cancer  screening. The HPV test looks for the virus that can cause the cell changes on the cervix. The cells collected during the Pap test can be tested for HPV. The HPV test could be used to screen women aged 57 years and older, and should be used in women of any age who have unclear Pap test results. After the age of 57, women should have HPV testing at the same frequency as a Pap test.  Colorectal cancer can be detected and often prevented. Most routine colorectal cancer screening begins at the age of 80 and continues through age 53. However, your caregiver may recommend screening at an earlier age if you have risk factors for colon cancer. On a yearly basis, your caregiver may provide home test kits to check for hidden blood in the stool. Use of a small camera at the end of a tube, to directly examine the colon (sigmoidoscopy or colonoscopy), can detect the earliest forms of colorectal cancer. Talk to your caregiver about this at age 45, when routine screening begins. Direct examination of the colon should be repeated every 5 to 10 years through age 83, unless early forms of pre-cancerous polyps or small growths are found.  Hepatitis C blood testing is recommended for all people born from [birth year] through 1965 and any individual with known risks for hepatitis C.  Practice safe sex. Use condoms and avoid high-risk sexual practices to reduce the spread of sexually transmitted infections (STIs). STIs include gonorrhea, chlamydia, syphilis, trichomonas, herpes, HPV, and human immunodeficiency virus (HIV). Herpes, HIV, and HPV are viral illnesses that have no cure. They can result in disability, cancer, and death. Sexually active women aged 70 and younger should be checked for chlamydia. Older women with new or multiple partners should also be tested for chlamydia. Testing for other STIs is recommended if you are sexually active and at increased risk.  Osteoporosis is a disease in which the bones lose minerals and  strength with aging. This can result in serious bone fractures. The risk of osteoporosis can be identified using  a bone density scan. Women ages 88 and over and women at risk for fractures or osteoporosis should discuss screening with their caregivers. Ask your caregiver whether you should take a calcium supplement or vitamin D to reduce the rate of osteoporosis.  Menopause can be associated with physical symptoms and risks. Hormone replacement therapy is available to decrease symptoms and risks. You should talk to your caregiver about whether hormone replacement therapy is right for you.  Use sunscreen with sun protection factor (SPF) of 30 or more. Apply sunscreen liberally and repeatedly throughout the day. You should seek shade when your shadow is shorter than you. Protect yourself by wearing long sleeves, pants, a wide-brimmed hat, and sunglasses year round, whenever you are outdoors.  Once a month, do a whole body skin exam, using a mirror to look at the skin on your back. Notify your caregiver of new moles, moles that have irregular borders, moles that are larger than a pencil eraser, or moles that have changed in shape or color.  Stay current with required immunizations.  Influenza. You need a dose every fall (or winter). The composition of the flu vaccine changes each year, so being vaccinated once is not enough.  Pneumococcal polysaccharide. You need 1 to 2 doses if you smoke cigarettes or if you have certain chronic medical conditions. You need 1 dose at age 28 (or older) if you have never been vaccinated.  Tetanus, diphtheria, pertussis (Tdap, Td). Get 1 dose of Tdap vaccine if you are younger than age 15, are over 80 and have contact with an infant, are a Research scientist (physical sciences), are pregnant, or simply want to be protected from whooping cough. After that, you need a Td booster dose every 10 years. Consult your caregiver if you have not had at least 3 tetanus and diphtheria-containing shots  sometime in your life or have a deep or dirty wound.  HPV. You need this vaccine if you are a woman age 14 or younger. The vaccine is given in 3 doses over 6 months.  Measles, mumps, rubella (MMR). You need at least 1 dose of MMR if you were born in 1957 or later. You may also need a second dose.  Meningococcal. If you are age 34 to 63 and a first-year college student living in a residence hall, or have one of several medical conditions, you need to get vaccinated against meningococcal disease. You may also need additional booster doses.  Zoster (shingles). If you are age 36 or older, you should get this vaccine.  Varicella (chickenpox). If you have never had chickenpox or you were vaccinated but received only 1 dose, talk to your caregiver to find out if you need this vaccine.  Hepatitis A. You need this vaccine if you have a specific risk factor for hepatitis A virus infection or you simply wish to be protected from this disease. The vaccine is usually given as 2 doses, 6 to 18 months apart.  Hepatitis B. You need this vaccine if you have a specific risk factor for hepatitis B virus infection or you simply wish to be protected from this disease. The vaccine is given in 3 doses, usually over 6 months. Preventive Services / Frequency Ages 11 to 46  Blood pressure check.** / Every 1 to 2 years.  Lipid and cholesterol check.** / Every 5 years beginning at age 58.  Clinical breast exam.** / Every 3 years for women in their 48s and 30s.  Pap test.** / Every 2 years from ages  21 through 29. Every 3 years starting at age 49 through age 5 or 38 with a history of 3 consecutive normal Pap tests.  HPV screening.** / Every 3 years from ages 3 through ages 39 to 31 with a history of 3 consecutive normal Pap tests.  Hepatitis C blood test.** / For any individual with known risks for hepatitis C.  Skin self-exam. / Monthly.  Influenza immunization.** / Every year.  Pneumococcal polysaccharide  immunization.** / 1 to 2 doses if you smoke cigarettes or if you have certain chronic medical conditions.  Tetanus, diphtheria, pertussis (Tdap, Td) immunization. / A one-time dose of Tdap vaccine. After that, you need a Td booster dose every 10 years.  HPV immunization. / 3 doses over 6 months, if you are 35 and younger.  Measles, mumps, rubella (MMR) immunization. / You need at least 1 dose of MMR if you were born in 1957 or later. You may also need a second dose.  Meningococcal immunization. / 1 dose if you are age 63 to 26 and a first-year college student living in a residence hall, or have one of several medical conditions, you need to get vaccinated against meningococcal disease. You may also need additional booster doses.  Varicella immunization.** / Consult your caregiver.  Hepatitis A immunization.** / Consult your caregiver. 2 doses, 6 to 18 months apart.  Hepatitis B immunization.** / Consult your caregiver. 3 doses usually over 6 months. Ages 17 to 53  Blood pressure check.** / Every 1 to 2 years.  Lipid and cholesterol check.** / Every 5 years beginning at age 12.  Clinical breast exam.** / Every year after age 30.  Mammogram.** / Every year beginning at age 3 and continuing for as long as you are in good health. Consult with your caregiver.  Pap test.** / Every 3 years starting at age 2 through age 50 or 4 with a history of 3 consecutive normal Pap tests.  HPV screening.** / Every 3 years from ages 43 through ages 33 to 58 with a history of 3 consecutive normal Pap tests.  Fecal occult blood test (FOBT) of stool. / Every year beginning at age 52 and continuing until age 86. You may not need to do this test if you get a colonoscopy every 10 years.  Flexible sigmoidoscopy or colonoscopy.** / Every 5 years for a flexible sigmoidoscopy or every 10 years for a colonoscopy beginning at age 33 and continuing until age 27.  Hepatitis C blood test.** / For all people born  from [birth year] through 1965 and any individual with known risks for hepatitis C.  Skin self-exam. / Monthly.  Influenza immunization.** / Every year.  Pneumococcal polysaccharide immunization.** / 1 to 2 doses if you smoke cigarettes or if you have certain chronic medical conditions.  Tetanus, diphtheria, pertussis (Tdap, Td) immunization.** / A one-time dose of Tdap vaccine. After that, you need a Td booster dose every 10 years.  Measles, mumps, rubella (MMR) immunization. / You need at least 1 dose of MMR if you were born in 1957 or later. You may also need a second dose.  Varicella immunization.** / Consult your caregiver.  Meningococcal immunization.** / Consult your caregiver.  Hepatitis A immunization.** / Consult your caregiver. 2 doses, 6 to 18 months apart.  Hepatitis B immunization.** / Consult your caregiver. 3 doses, usually over 6 months. Ages 57 and over  Blood pressure check.** / Every 1 to 2 years.  Lipid and cholesterol check.** / Every 5 years beginning  at age 76.  Clinical breast exam.** / Every year after age 94.  Mammogram.** / Every year beginning at age 71 and continuing for as long as you are in good health. Consult with your caregiver.  Pap test.** / Every 3 years starting at age 58 through age 55 or 22 with a 3 consecutive normal Pap tests. Testing can be stopped between 65 and 70 with 3 consecutive normal Pap tests and no abnormal Pap or HPV tests in the past 10 years.  HPV screening.** / Every 3 years from ages 76 through ages 17 or 97 with a history of 3 consecutive normal Pap tests. Testing can be stopped between 65 and 70 with 3 consecutive normal Pap tests and no abnormal Pap or HPV tests in the past 10 years.  Fecal occult blood test (FOBT) of stool. / Every year beginning at age 40 and continuing until age 15. You may not need to do this test if you get a colonoscopy every 10 years.  Flexible sigmoidoscopy or colonoscopy.** / Every 5 years for a  flexible sigmoidoscopy or every 10 years for a colonoscopy beginning at age 62 and continuing until age 52.  Hepatitis C blood test.** / For all people born from [birth year] through 1965 and any individual with known risks for hepatitis C.  Osteoporosis screening.** / A one-time screening for women ages 60 and over and women at risk for fractures or osteoporosis.  Skin self-exam. / Monthly.  Influenza immunization.** / Every year.  Pneumococcal polysaccharide immunization.** / 1 dose at age 54 (or older) if you have never been vaccinated.  Tetanus, diphtheria, pertussis (Tdap, Td) immunization. / A one-time dose of Tdap vaccine if you are over 65 and have contact with an infant, are a Research scientist (physical sciences), or simply want to be protected from whooping cough. After that, you need a Td booster dose every 10 years.  Varicella immunization.** / Consult your caregiver.  Meningococcal immunization.** / Consult your caregiver.  Hepatitis A immunization.** / Consult your caregiver. 2 doses, 6 to 18 months apart.  Hepatitis B immunization.** / Check with your caregiver. 3 doses, usually over 6 months. ** Family history and personal history of risk and conditions may change your caregiver's recommendations. Document Released: 01/29/2002 Document Revised: 02/25/2012 Document Reviewed: 04/30/2011 [facility] Patient Information 2013 [place], [place].     Neta Mends. Stellan Vick M.D.

## 2013-04-27 LAB — TB SKIN TEST: TB Skin Test: NEGATIVE

## 2013-04-29 ENCOUNTER — Encounter: Payer: Self-pay | Admitting: Family Medicine

## 2013-05-01 ENCOUNTER — Encounter: Payer: Self-pay | Admitting: Internal Medicine

## 2013-05-01 DIAGNOSIS — IMO0002 Reserved for concepts with insufficient information to code with codable children: Secondary | ICD-10-CM | POA: Insufficient documentation

## 2013-06-25 ENCOUNTER — Other Ambulatory Visit: Payer: Self-pay | Admitting: *Deleted

## 2013-06-25 MED ORDER — CIPROFLOXACIN HCL 500 MG PO TABS: 500.0000 mg | ORAL_TABLET | Freq: Two times a day (BID) | ORAL | Status: DC

## 2013-10-22 ENCOUNTER — Other Ambulatory Visit: Payer: Self-pay

## 2015-04-18 ENCOUNTER — Telehealth: Payer: Self-pay | Admitting: Family Medicine

## 2015-04-18 NOTE — Telephone Encounter (Signed)
Wed may 18 11:30 or 12 noon Friday may 13, 3 pm Thursday Apr 27 1044 and 11 am.  Let me know if wont work

## 2015-04-18 NOTE — Telephone Encounter (Signed)
Patient's father (Dr. Kirtland BouchardK) would like for the pt to have her annual visit in the next few weeks.  She will be traveling out of the country in the next few weeks.  Please advise where you can fit her in. Thanks!

## 2015-04-19 NOTE — Telephone Encounter (Signed)
Spoke to Dr. Rudean HaskellK.  Gail Bender will come on the 12th.  Pt has been scheduled.

## 2015-04-28 ENCOUNTER — Encounter: Payer: Self-pay | Admitting: Internal Medicine

## 2015-05-09 ENCOUNTER — Encounter: Payer: Self-pay | Admitting: Family Medicine

## 2015-05-09 ENCOUNTER — Ambulatory Visit (INDEPENDENT_AMBULATORY_CARE_PROVIDER_SITE_OTHER): Payer: BLUE CROSS/BLUE SHIELD | Admitting: Family Medicine

## 2015-05-09 VITALS — BP 110/70 | HR 63 | Temp 98.4°F | Wt 134.0 lb

## 2015-05-09 DIAGNOSIS — R0609 Other forms of dyspnea: Secondary | ICD-10-CM | POA: Diagnosis not present

## 2015-05-09 DIAGNOSIS — R5383 Other fatigue: Secondary | ICD-10-CM | POA: Diagnosis not present

## 2015-05-09 NOTE — Progress Notes (Addendum)
Subjective:    Patient ID: Gail LungJennifer M Virnig, female    DOB: 03/08/1981, 34 y.o.   MRN: [id number]  HPI Patient seen for evaluation of decreased exercise tolerance and exertional dyspnea going back to around September 2015. Generally very healthy nonsmoker with no major chronic medical problems. She did some exploring out [place] in August and this did include [place] exploration and then had onset of symptoms around September. She has never had any fever or cough. No history of asthma. She has noticed some dyspnea with activity and increased fatigue with activity but has never had any associated syncope or dizziness with exertion. Over this time., She has had normal appetite and no weight changes.  She was evaluated at a medical clinic through [facility] and had fairly extensive workup. Those records are reviewed. She had multiple labs including CBC, comprehensive metabolic panel, thyroid functions, d-dimer, EKG, chest x-ray, and spirometry which were all unremarkable. PPD was reportedly normal. They were in process of setting her up to see pulmonary but she had scheduling conflicts and never went.  She's never had any associated symptoms of arthralgia, skin rash, persistent adenopathy, night sweats.  No history of any known heart murmurs. No dyspnea at rest. Denies any recent cough. No active reflux symptoms. No consistent postnasal drip.  Past Medical History  Diagnosis Date  . Chicken pox   . Hx of fracture of arm     remote no residual   No past surgical history on file.  reports that she has never smoked. She does not have any smokeless tobacco history on file. She reports that she drinks alcohol. Her drug history is not on file. family history includes ADD / ADHD in an other family member; Heart disease in an other family member. No Known Allergies    Review of Systems  Constitutional: Positive for fatigue. Negative for fever, chills, appetite change and unexpected weight  change.  HENT: Negative for trouble swallowing and voice change.   Respiratory: Positive for shortness of breath. Negative for cough and wheezing.   Cardiovascular: Negative for chest pain, palpitations and leg swelling.  Gastrointestinal: Negative for abdominal pain.  Genitourinary: Negative for dysuria.  Neurological: Negative for dizziness and weakness.       Objective:   Physical Exam  Constitutional: She appears well-developed and well-nourished. No distress.  HENT:  Left tonsil is larger than right but no concerns for atypical appearance.  Neck: Neck supple. No thyromegaly present.  Cardiovascular: Normal rate, regular rhythm and normal heart sounds.  Exam reveals no gallop and no friction rub.   No murmur heard. Pulmonary/Chest: Effort normal and breath sounds normal. No respiratory distress. She has no wheezes. She has no rales.  No O2 desaturation with jogging up and down hall here in office today.  Abdominal: Soft. Bowel sounds are normal. She exhibits no distension and no mass. There is no tenderness. There is no rebound and no guarding.  Musculoskeletal: She exhibits no edema.  Lymphadenopathy:    She has no cervical adenopathy.          Assessment & Plan:  Patient presents with several months of increased fatigue and exertional dyspnea. Nonfocal exam and extensive workup as above unrevealing. No anemia. Doubt primary pulmonary issue. She has not had any history to suggest likely atypical infection such as cough or fever. Heart exam unremarkable. Would consider echocardiogram, though she's not had any syncope or other concern for subaortic stenosis. She is getting ready to leave for  [place] tomorrow. If symptoms persist when she gets back, will proceed with echocardiogram.   Patient continued to have symptoms of exertional dyspnea after her trip to [place]. We therefore ordered an echocardiogram and after discussion with cardiology set up cardiopulmonary exercise test. Her  echocardiogram and cardiopulmonary exercise test were unremarkable. Patient aware of results she did not have any evidence for any obvious circulatory or ventilatory limitations during testing

## 2015-05-09 NOTE — Progress Notes (Signed)
Pre visit review using our clinic review tool, if applicable. No additional management support is needed unless otherwise documented below in the visit note. 

## 2015-05-09 NOTE — Patient Instructions (Signed)
Touch base when you get back from [place] and if symptoms persist would consider echocardiogram at that time.

## 2015-07-06 ENCOUNTER — Other Ambulatory Visit: Payer: Self-pay | Admitting: Family Medicine

## 2015-07-06 DIAGNOSIS — R0609 Other forms of dyspnea: Principal | ICD-10-CM

## 2015-07-08 ENCOUNTER — Telehealth: Payer: Self-pay

## 2015-07-11 ENCOUNTER — Ambulatory Visit (HOSPITAL_COMMUNITY): Payer: BLUE CROSS/BLUE SHIELD | Attending: Cardiovascular Disease

## 2015-07-11 ENCOUNTER — Other Ambulatory Visit: Payer: Self-pay

## 2015-07-11 ENCOUNTER — Ambulatory Visit (HOSPITAL_COMMUNITY): Payer: BLUE CROSS/BLUE SHIELD

## 2015-07-11 DIAGNOSIS — I340 Nonrheumatic mitral (valve) insufficiency: Secondary | ICD-10-CM | POA: Insufficient documentation

## 2015-07-11 DIAGNOSIS — R0609 Other forms of dyspnea: Secondary | ICD-10-CM

## 2015-07-11 DIAGNOSIS — R0600 Dyspnea, unspecified: Secondary | ICD-10-CM | POA: Diagnosis present

## 2015-07-13 DIAGNOSIS — R0600 Dyspnea, unspecified: Secondary | ICD-10-CM | POA: Diagnosis not present

## 2015-07-20 NOTE — Addendum Note (Signed)
Addended by: Kristian Covey on: 07/20/2015 09:27 AM   Modules accepted: Level of Service

## 2017-04-19 ENCOUNTER — Encounter: Payer: Self-pay | Admitting: Family Medicine

## 2017-04-19 ENCOUNTER — Ambulatory Visit (INDEPENDENT_AMBULATORY_CARE_PROVIDER_SITE_OTHER): Payer: BLUE CROSS/BLUE SHIELD | Admitting: Family Medicine

## 2017-04-19 VITALS — BP 98/60 | HR 55 | Temp 98.3°F | Ht 62.0 in | Wt 140.1 lb

## 2017-04-19 DIAGNOSIS — N6310 Unspecified lump in the right breast, unspecified quadrant: Secondary | ICD-10-CM | POA: Diagnosis not present

## 2017-04-19 DIAGNOSIS — Z3041 Encounter for surveillance of contraceptive pills: Secondary | ICD-10-CM

## 2017-04-19 MED ORDER — NORETHINDRONE ACET-ETHINYL EST 1.5-30 MG-MCG PO TABS: 1.0000 | ORAL_TABLET | Freq: Every day | ORAL | 3 refills | Status: AC

## 2017-04-19 NOTE — Progress Notes (Signed)
  HPI:  Acute visit for several concerns:  1) breast lump: -R breast -x1-2 months -this is a change, has remained stable since she noticed -no nipple discharge, skin changes, pain, fevers, redness, malaise, wt loss -PGM with breast ca, not other sig FH per her report  2) contraception: -asks about other forms of contraception -wants refills on pil-no recent missed doses -declines STI testing -periods regular and monthly -las period 3 wks ago ROS: See pertinent positives and negatives per HPI.  Past Medical History:  Diagnosis Date  . Chicken pox   . Hx of fracture of arm    remote no residual    No past surgical history on file.  Family History  Problem Relation Age of Onset  . Heart disease      non first degree relative   . ADD / ADHD      sib     Social History   Social History  . Marital status: Married    Spouse name: N/A  . Number of children: N/A  . Years of education: N/A   Social History Main Topics  . Smoking status: Former Games developermoker  . Smokeless tobacco: Never Used  . Alcohol use Yes  . Drug use: Unknown  . Sexual activity: Not Asked   Other Topics Concern  . None   Social History Narrative   Single to go to vet school [place]   Neg td               Current Outpatient Prescriptions:  .  Norethindrone Acetate-Ethinyl Estradiol (MICROGESTIN) 1.5-30 MG-MCG tablet, Take 1 tablet by mouth daily., Disp: 3 Package, Rfl: 3  EXAM:  Vitals:   04/19/17 0847  BP: 98/60  Pulse: (!) 55  Temp: 98.3 F (36.8 C)    Body mass index is 25.62 kg/m.  GENERAL: vitals reviewed and listed above, alert, oriented, appears well hydrated and in no acute distress  HEENT: atraumatic, conjunttiva clear, no obvious abnormalities on inspection of external nose and ears  NECK: no obvious masses on inspection  BREAST: normal appearance - no skin lesions or discharge noted on inspection of both breasts, on palpation of both breast and axillary region no  suspicious lesions appreciated today except for small, 1cm in diameter, fairly superficial, rubbery lump at 8'Oclock in area of her concern approx 1.5-2 cm from the areola  MS: moves all extremities without noticeable abnormality  PSYCH: pleasant and cooperative, no obvious depression or anxiety  ASSESSMENT AND PLAN:  Discussed the following assessment and plan:  Lump of right breast - Plan: MM DIAG BREAST TOMO UNI RIGHT, US BREAST LTD UNI RIGHT INC AXILLA -we discussed possible serious and likely etiologies for the breast abnormailty, workup and treatment, treatment risks and return precautions - suspect cyst -after this discussion, Gail Bender opted for evaluation at the breast center to confirm - assistant to place referral -follow up advised as needed -of course, we advised Gail Bender  to return or notify a doctor immediately if symptoms worsen or persist or new concerns arise.  Oral contraceptive use -discussed various forms of contraception -she has opted to stick with current form -refills provided  Pt left prior to AVS being ready. There are no Patient Instructions on file for this visit.  Kriste BasqueKIM, Kindle Strohmeier R., DO

## 2017-04-19 NOTE — Progress Notes (Signed)
Pre visit review using our clinic review tool, if applicable. No additional management support is needed unless otherwise documented below in the visit note. 

## 2017-09-06 ENCOUNTER — Encounter: Payer: Self-pay | Admitting: Internal Medicine
